# Patient Record
Sex: Male | Born: 1970 | Race: White | Hispanic: No | Marital: Single | Smoking: Never smoker
Health system: Southern US, Community
[De-identification: ages and names within clinical notes are randomized; demographics above are authoritative.]

## PROBLEM LIST (undated history)

## (undated) DIAGNOSIS — I10 Essential (primary) hypertension: Secondary | ICD-10-CM

---

## 2007-09-11 ENCOUNTER — Ambulatory Visit: Payer: Self-pay | Admitting: Internal Medicine

## 2010-04-04 ENCOUNTER — Ambulatory Visit: Payer: Self-pay | Admitting: Internal Medicine

## 2019-09-27 ENCOUNTER — Ambulatory Visit
Admission: EM | Admit: 2019-09-27 | Discharge: 2019-09-27 | Disposition: A | Discharge status: Home / Self Care | Payer: 59 | Attending: Family Medicine | Admitting: Family Medicine

## 2019-09-27 ENCOUNTER — Encounter: Payer: Self-pay | Admitting: Emergency Medicine

## 2019-09-27 ENCOUNTER — Other Ambulatory Visit: Payer: Self-pay

## 2019-09-27 DIAGNOSIS — H6502 Acute serous otitis media, left ear: Secondary | ICD-10-CM

## 2019-09-27 HISTORY — DX: Essential (primary) hypertension: I10

## 2019-09-27 MED ORDER — AMOXICILLIN 875 MG PO TABS: 875.0000 mg | ORAL_TABLET | Freq: Two times a day (BID) | ORAL | 0 refills | Status: DC

## 2019-09-27 NOTE — Discharge Instructions (Signed)
Steroid nasal spray, over the counter antihistamine/decongestant

## 2019-09-27 NOTE — ED Triage Notes (Signed)
Patient c/o left ear pain off and on since Monday.  Patient denies fevers. Patient denies any other cold symptoms.  Patient denies any drainage from the ear.

## 2019-09-27 NOTE — ED Provider Notes (Signed)
[facility]    CSN: [id number] Arrival date & time: 09/27/19  1702      History   Chief Complaint Chief Complaint  Patient presents with  . Otalgia    left    HPI Jerry Chang is a 49 y.o. male.   49 yo male with a c/o left ear pain on and off since Monday. Patient denies any fevers, drainage, injuries. States it seems to be worse in the mornings then it eases off.    Otalgia   Past Medical History:  Diagnosis Date  . Hypertension     There are no problems to display for this patient.   History reviewed. No pertinent surgical history.     Home Medications    Prior to Admission medications   Medication Sig Start Date End Date Taking? Authorizing Provider  amLODipine (NORVASC) 10 MG tablet amlodipine 10 mg tablet 09/10/09  Yes [provider]  cyclobenzaprine (FLEXERIL) 5 MG tablet cyclobenzaprine 5 mg tablet 08/14/19  Yes [provider]  fexofenadine (ALLEGRA) 180 MG tablet Take by mouth. 03/01/03  Yes [provider]  metoprolol (TOPROL-XL) 200 MG 24 hr tablet metoprolol succinate ER 200 mg tablet,extended release 24 hr 10/27/10  Yes [provider]  Multiple Vitamin (DAILY VITAMINS) tablet Take by mouth. 02/28/09  Yes [provider]  amoxicillin (AMOXIL) 875 MG tablet Take 1 tablet (875 mg total) by mouth 2 (two) times daily. 09/27/19   Payton Mccallum, MD    Family History Family History  Problem Relation Age of Onset  . Healthy Mother   . Healthy Father     Social History Social History   Tobacco Use  . Smoking status: Never Smoker  . Smokeless tobacco: Never Used  Substance Use Topics  . Alcohol use: Yes  . Drug use: Never     Allergies   Ace inhibitors, Hydrochlorothiazide, and Sulfa antibiotics   Review of Systems Review of Systems  HENT: Positive for ear pain.      Physical Exam Triage Vital Signs ED Triage Vitals  Enc Vitals Group     BP 09/27/19 1720 (!) 138/99     Pulse  Rate 09/27/19 1720 72     Resp 09/27/19 1720 16     Temp 09/27/19 1720 98.5 F (36.9 C)     Temp Source 09/27/19 1720 Oral     SpO2 09/27/19 1720 98 %     Weight 09/27/19 1716 270 lb (122.5 kg)     Height 09/27/19 1716 5\' 10"  (1.778 m)     Head Circumference --      Peak Flow --      Pain Score 09/27/19 1716 1     Pain Loc --      Pain Edu? --      Excl. in [place]? --    No data found.  Updated Vital Signs BP (!) 138/99 (BP Location: Left Arm)   Pulse 72   Temp 98.5 F (36.9 C) (Oral)   Resp 16   Ht 5\' 10"  (1.778 m)   Wt 122.5 kg   SpO2 98%   BMI 38.74 kg/m   Visual Acuity Right Eye Distance:   Left Eye Distance:   Bilateral Distance:    Right Eye Near:   Left Eye Near:    Bilateral Near:     Physical Exam Vitals and nursing note reviewed.  Constitutional:      General: He is not in acute distress.    Appearance: He  is not toxic-appearing or diaphoretic.  HENT:     Right Ear: Tympanic membrane normal.     Left Ear: A middle ear effusion is present. Tympanic membrane is erythematous and bulging.     Nose: Nose normal.     Mouth/Throat:     Pharynx: Oropharynx is clear.  Pulmonary:     Effort: Pulmonary effort is normal. No respiratory distress.  Neurological:     Mental Status: He is alert.      UC Treatments / Results  Labs (all labs ordered are listed, but only abnormal results are displayed) Labs Reviewed - No data to display  EKG   Radiology No results found.  Procedures Procedures (including critical care time)  Medications Ordered in UC Medications - No data to display  Initial Impression / Assessment and Plan / UC Course  I have reviewed the triage vital signs and the nursing notes.  Pertinent labs & imaging results that were available during my care of the patient were reviewed by me and considered in my medical decision making (see chart for details).      Final Clinical Impressions(s) / UC Diagnoses   Final diagnoses:  Acute  serous otitis media of left ear, recurrence not specified     Discharge Instructions     Steroid nasal spray, over the counter antihistamine/decongestant    ED Prescriptions    Medication Sig Dispense Auth. Provider   amoxicillin (AMOXIL) 875 MG tablet Take 1 tablet (875 mg total) by mouth 2 (two) times daily. 14 tablet Lashe Oliveira, Linward Foster, MD      1. diagnosis reviewed with patient 2. rx as per orders above; reviewed possible side effects, interactions, risks and benefits  3. Recommend supportive treatment as above 4. Follow-up prn if symptoms worsen or don't improve   PDMP not reviewed this encounter.   Norval Gable, MD 09/27/19 [phone]

## 2021-09-05 ENCOUNTER — Other Ambulatory Visit: Payer: Self-pay

## 2021-09-05 ENCOUNTER — Telehealth: Payer: Self-pay | Admitting: Internal Medicine

## 2021-09-05 ENCOUNTER — Ambulatory Visit (INDEPENDENT_AMBULATORY_CARE_PROVIDER_SITE_OTHER): Payer: 59

## 2021-09-05 ENCOUNTER — Encounter: Payer: Self-pay | Admitting: Emergency Medicine

## 2021-09-05 ENCOUNTER — Ambulatory Visit
Admission: EM | Admit: 2021-09-05 | Discharge: 2021-09-05 | Disposition: A | Discharge status: Home / Self Care | Payer: 59 | Attending: Internal Medicine | Admitting: Internal Medicine

## 2021-09-05 DIAGNOSIS — R059 Cough, unspecified: Secondary | ICD-10-CM

## 2021-09-05 DIAGNOSIS — J069 Acute upper respiratory infection, unspecified: Secondary | ICD-10-CM | POA: Diagnosis not present

## 2021-09-05 MED ORDER — GUAIFENESIN-CODEINE 100-10 MG/5ML PO SOLN: ORAL | 0 refills | Status: AC

## 2021-09-05 MED ORDER — GUAIFENESIN-CODEINE 225-7.5 MG/5ML PO LIQD: ORAL | 0 refills | Status: DC

## 2021-09-05 MED ORDER — FLUTICASONE PROPIONATE 50 MCG/ACT NA SUSP: NASAL | 0 refills | Status: AC

## 2021-09-05 NOTE — ED Triage Notes (Addendum)
Patient c/o sore throat, dry cough, and headache that started last Tuesday (5 days).  Patient denies fevers. Patient states he was seen on Friday for his symptoms and had a negative flu, covid and strep test.  Patient states that he was given Benzonatate for cough and states that it has not helped.  Patient states he is unable to sleep at night due to his cough.

## 2021-09-05 NOTE — Telephone Encounter (Signed)
Received a call from [facility] in [place] stating that the Guaifenesin-Codeine prescription was too high and that the written prescription given to the patient could not be filled. Informed the provider Garey Ham and she gave a verbal order for them to fill the prescription with the available dose as she can not e-prescribe at the moment. Called [facility] and they stated that they need a new prescription sent in either electronically or a print off for Guaifenesin-Codeine 100-10/39ml syrup. Informed Nettie Elm and a new prescription was printed. Called and spoke with Thereasa Distance and informed him of the new prescription. Patient has stated that he will come back to the urgent care to pick up the new prescription. Prescription was given to front desk staff for patient pick up.

## 2022-01-27 NOTE — ED Provider Notes (Signed)
[facility]    CSN: [id number] Arrival date & time: 09/05/21  0840      History   Chief Complaint Chief Complaint  Patient presents with   Sore Throat   Headache    HPI Jerry Chang is a 51 y.o. male who presents with ST, non productive cough, and HA x 5 days. He denies fevers. Was seen one week ago and had negative Covid, Flu and strep tests. Has been taking Tessalon, but has not helped. He is having a hard time sleeping due to the cough.     Past Medical History:  Diagnosis Date   Hypertension     There are no problems to display for this patient.   History reviewed. No pertinent surgical history.     Home Medications    Prior to Admission medications   Medication Sig Start Date End Date Taking? Authorizing Provider  amLODipine (NORVASC) 10 MG tablet amlodipine 10 mg tablet 09/10/09  Yes [provider]  cyclobenzaprine (FLEXERIL) 5 MG tablet cyclobenzaprine 5 mg tablet 08/14/19  Yes [provider]  fluticasone (FLONASE ALLERGY RELIEF) 50 MCG/ACT nasal spray Place 2 sprays into both nostrils daily AND 2 sprays daily. For 7 days for nose congestion, and fluid in ears. 09/05/21  Yes Rodriguez-Southworth, Sunday Spillers, PA-C  guaiFENesin-codeine 100-10 MG/5ML syrup 1-2 tsp qhs prn cough 09/05/21  Yes Rodriguez-Southworth, Sunday Spillers, PA-C  metoprolol (TOPROL-XL) 200 MG 24 hr tablet metoprolol succinate ER 200 mg tablet,extended release 24 hr 10/27/10  Yes [provider]  Multiple Vitamin (DAILY VITAMINS) tablet Take by mouth. 02/28/09  Yes [provider]  fexofenadine (ALLEGRA) 180 MG tablet Take by mouth. 03/01/03   [provider]    Family History Family History  Problem Relation Age of Onset   Healthy Mother    Healthy Father     Social History Social History   Tobacco Use   Smoking status: Never   Smokeless tobacco: Never  Vaping Use   Vaping Use: Never used  Substance Use Topics   Alcohol use: Yes   Drug  use: Never     Allergies   Ace inhibitors, Hydrochlorothiazide, and Sulfa antibiotics   Review of Systems Review of Systems  Constitutional:  Negative for appetite change, chills and fever.  HENT:  Positive for postnasal drip and sore throat. Negative for ear discharge and ear pain.   Eyes:  Negative for discharge.  Respiratory:  Positive for cough.     Physical Exam Triage Vital Signs ED Triage Vitals  Enc Vitals Group     BP 09/05/21 1015 (!) 149/71     Pulse Rate 09/05/21 1015 87     Resp 09/05/21 1015 15     Temp 09/05/21 1015 98.3 F (36.8 C)     Temp Source 09/05/21 1015 Oral     SpO2 09/05/21 1015 100 %     Weight 09/05/21 0905 270 lb (122.5 kg)     Height 09/05/21 0905 5\' 10"  (1.778 m)     Head Circumference --      Peak Flow --      Pain Score 09/05/21 0905 8     Pain Loc --      Pain Edu? --      Excl. in [place]? --    No data found.  Updated Vital Signs BP (!) 149/71 (BP Location: Left Arm)   Pulse 87   Temp 98.3 F (36.8 C) (Oral)   Resp 15   Ht 5\' 10"  (  1.778 m)   Wt 270 lb (122.5 kg)   SpO2 100%   BMI 38.74 kg/m   Visual Acuity Right Eye Distance:   Left Eye Distance:   Bilateral Distance:    Right Eye Near:   Left Eye Near:    Bilateral Near:      Physical Exam Vitals signs and nursing note reviewed.  Constitutional:      General: he is not in acute distress.    Appearance: Normal appearance. He is not ill-appearing, toxic-appearing or diaphoretic.  HENT:     Head: Normocephalic.     Right Ear: Tympanic membrane, ear canal and external ear normal.     Left Ear: Tympanic membrane, ear canal and external ear normal.     Nose: Nose normal.     Mouth/Throat:     Mouth: Mucous membranes are moist.  Eyes:     General: No scleral icterus.       Right eye: No discharge.        Left eye: No discharge.     Conjunctiva/sclera: Conjunctivae normal.  Neck:     Musculoskeletal: Neck supple. No neck rigidity.  Cardiovascular:     Rate and  Rhythm: Normal rate and regular rhythm.     Heart sounds: No murmur.  Pulmonary:     Effort: Pulmonary effort is normal.     Breath sounds: Normal breath sounds.   Musculoskeletal: Normal range of motion.  Lymphadenopathy:     Cervical: No cervical adenopathy.  Skin:    General: Skin is warm and dry.     Coloration: Skin is not jaundiced.     Findings: No rash.  Neurological:     Mental Status: he is alert and oriented to person, place, and time.     Gait: Gait normal.  Psychiatric:        Mood and Affect: Mood normal.        Behavior: Behavior normal.        Thought Content: Thought content normal.        Judgment: Judgment normal.    UC Treatments / Results  Labs (all labs ordered are listed, but only abnormal results are displayed) Labs Reviewed - No data to display  EKG   Radiology No results found.  Procedures Procedures (including critical care time)  Medications Ordered in UC Medications - No data to display  Initial Impression / Assessment and Plan / UC Course  I have reviewed the triage vital signs and the nursing notes.  URI with cough  I placed him on Flonase and [facility] as noted   Final Clinical Impressions(s) / UC Diagnoses   Final diagnoses:  Upper respiratory infection with cough and congestion   Discharge Instructions   None    ED Prescriptions     Medication Sig Dispense Auth. Provider   guaiFENesin-Codeine 225-7.5 MG/5ML LIQD  (Status: Discontinued) 5-10 ml qhs prn cough 120 mL Rodriguez-Southworth, Phu Record, PA-C   fluticasone (FLONASE ALLERGY RELIEF) 50 MCG/ACT nasal spray Place 2 sprays into both nostrils daily AND 2 sprays daily. For 7 days for nose congestion, and fluid in ears. 18.2 g Rodriguez-Southworth, Sunday Spillers, PA-C   guaiFENesin-codeine 100-10 MG/5ML syrup 1-2 tsp qhs prn cough 120 mL Rodriguez-Southworth, Sunday Spillers, PA-C      I have reviewed the PDMP during this encounter.   Shelby Mattocks, Hershal Coria 01/27/22 2039

## 2023-01-30 IMAGING — CR DG CHEST 2V
3 series · 3 of 3 positions shown · non-contrast
Comparison: None.

CLINICAL DATA: Cough.

EXAM:
CHEST - 2 VIEW

[chest pa]
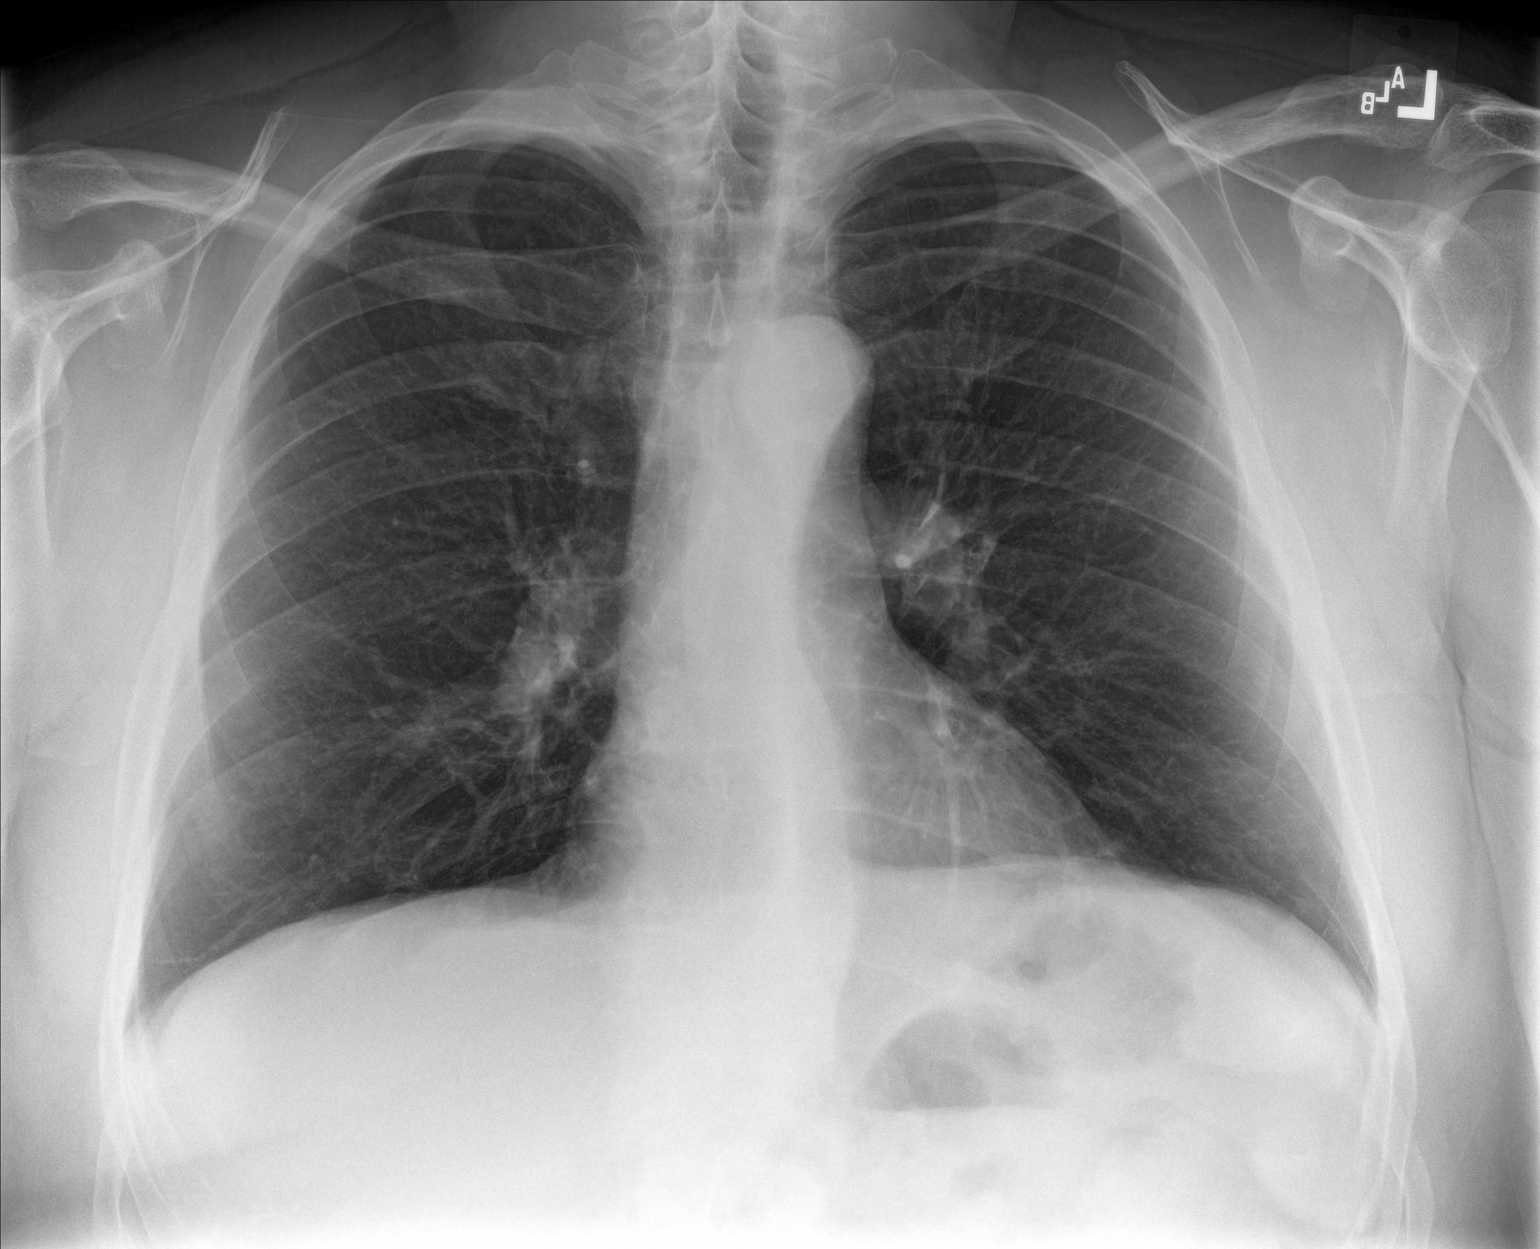

[chest lat (1 of 2)]
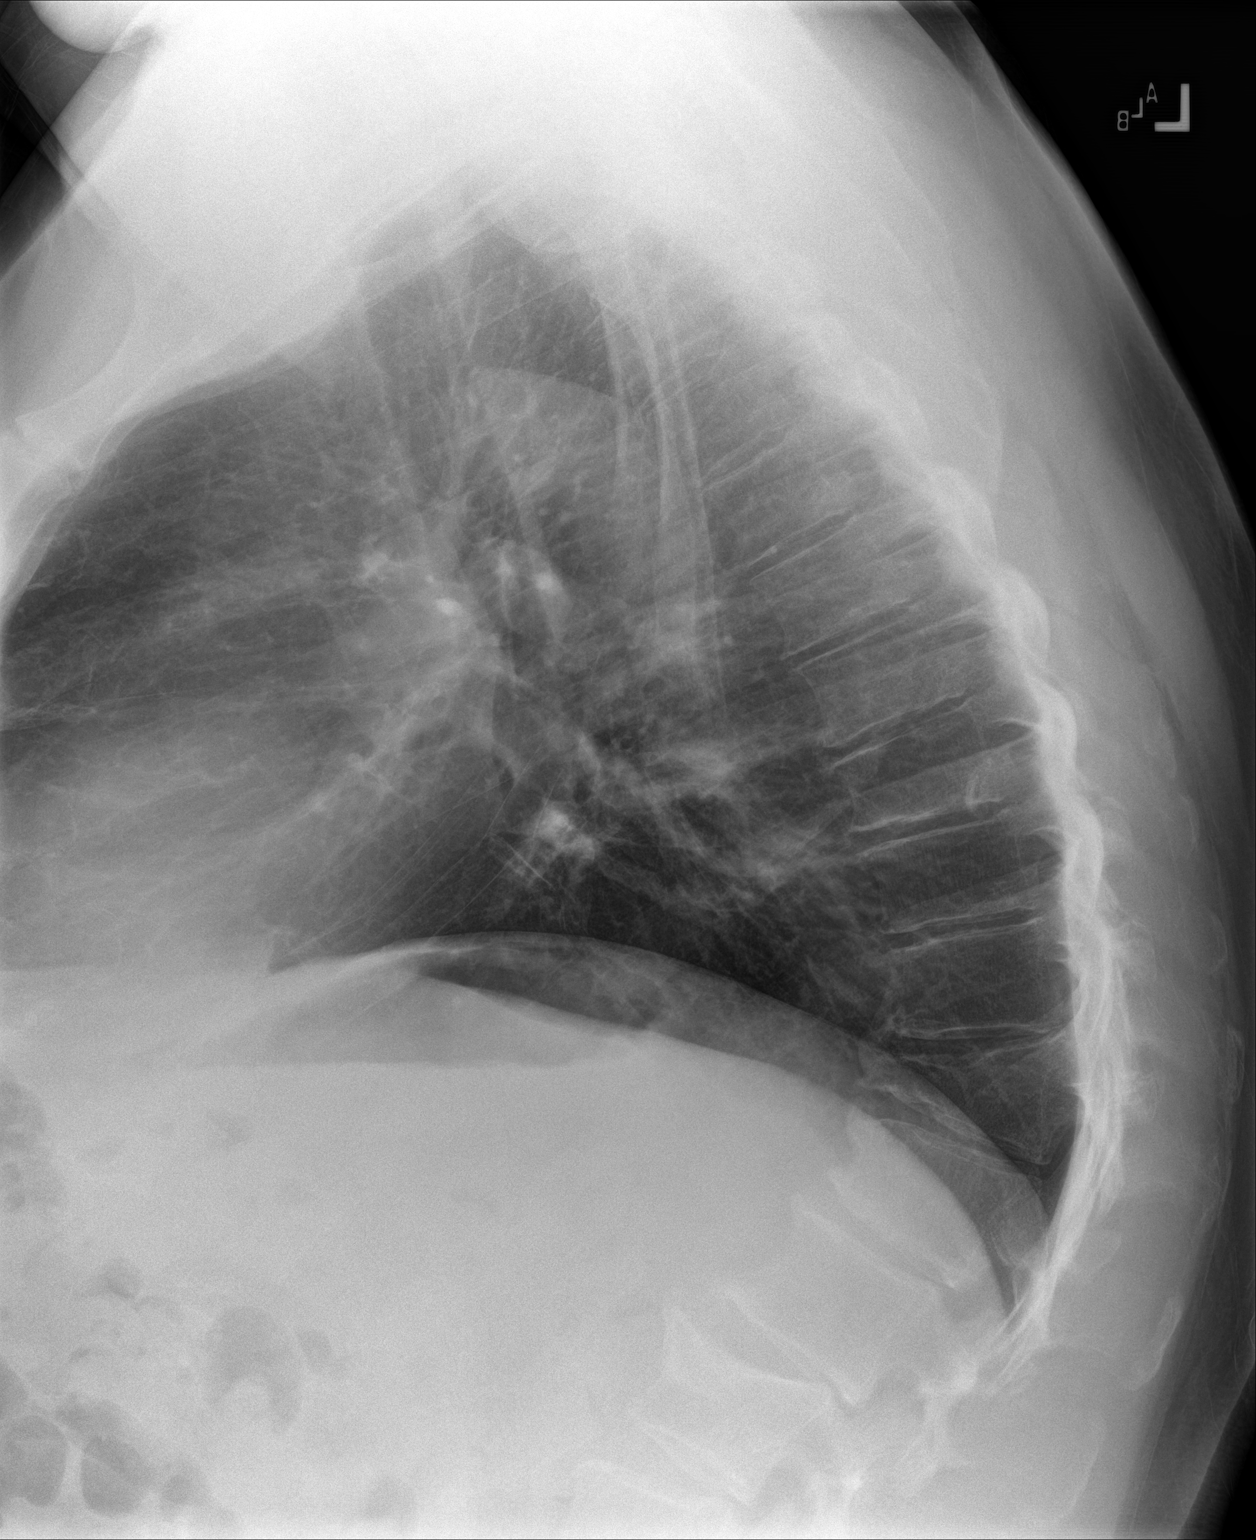

[chest lat (2 of 2)]
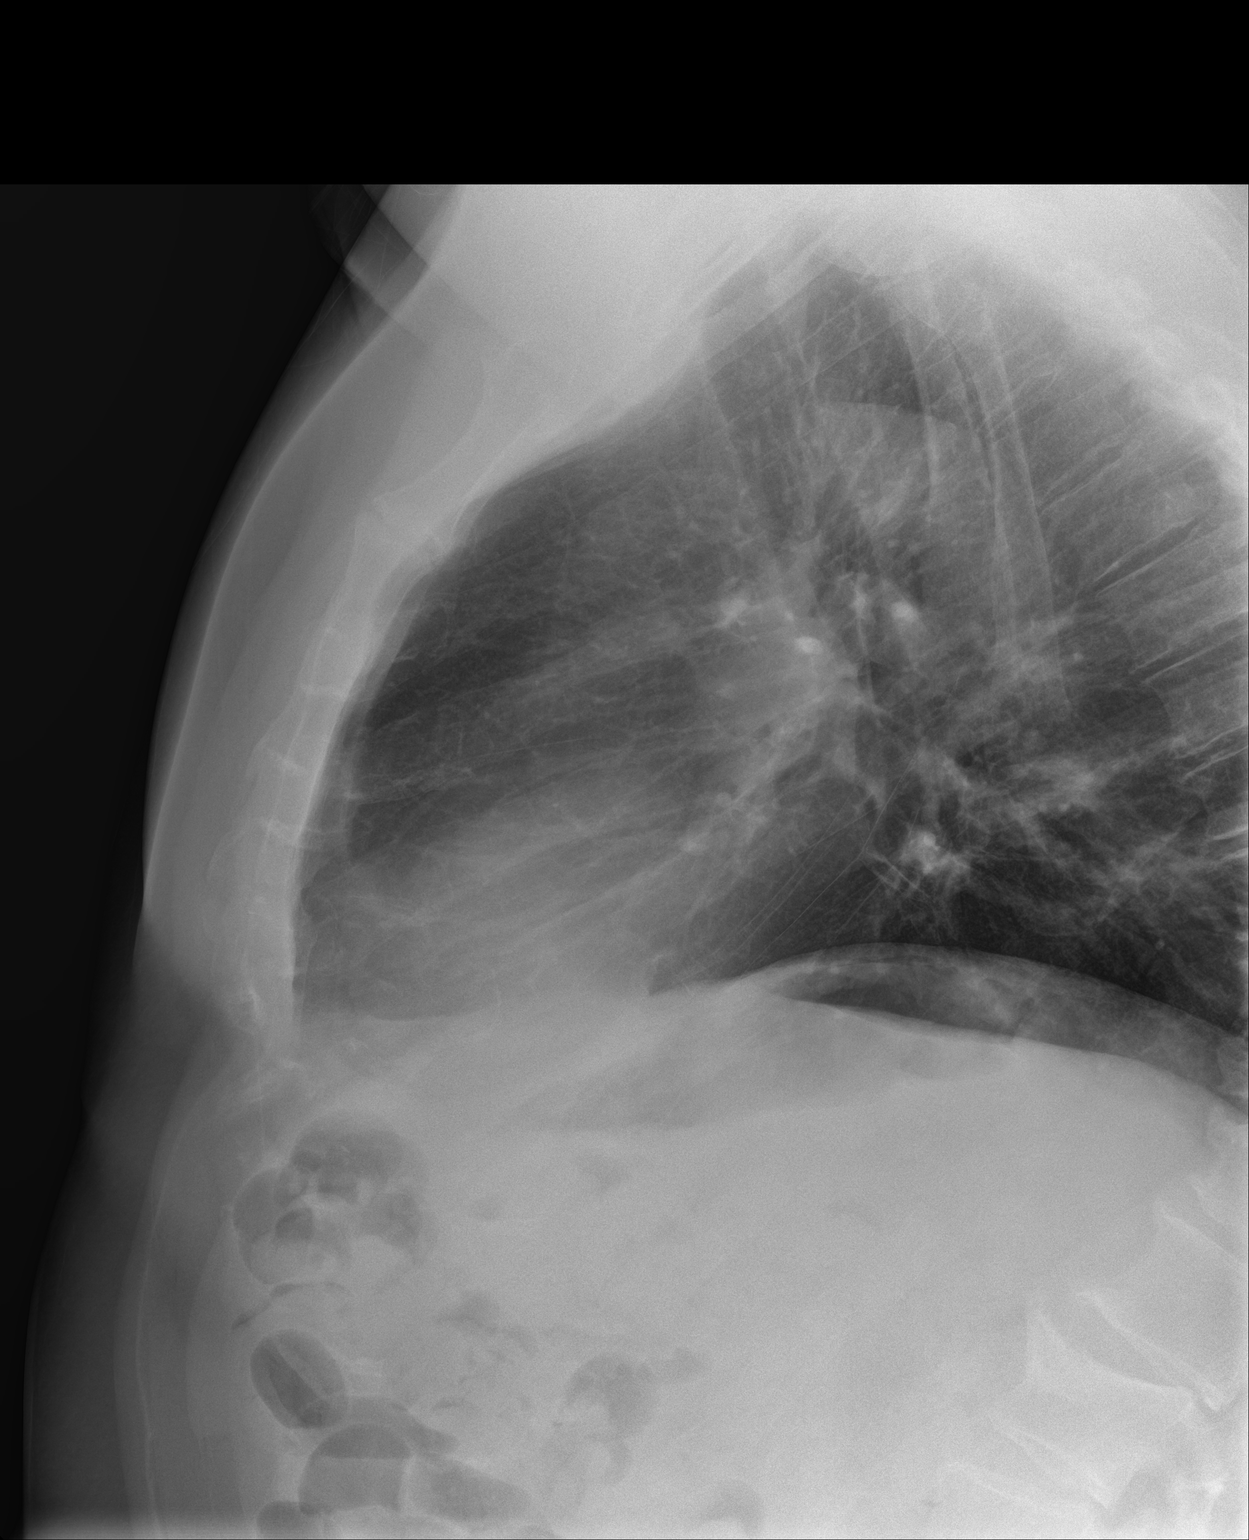

[3 of 3 positions shown; findings below may reference images not displayed]

FINDINGS: The lungs are clear without focal pneumonia, edema, pneumothorax or
pleural effusion. The cardiopericardial silhouette is within normal
limits for size. Lower thoracic compression fracture noted with
superior endplate compression deformity at L2.
IMPRESSION: No acute cardiopulmonary findings.
